# Patient Record
Sex: Male | Born: 2000 | Race: White | Hispanic: No | Marital: Single
Health system: Southern US, Community
[De-identification: ages and names within clinical notes are randomized; demographics above are authoritative.]

## PROBLEM LIST (undated history)

## (undated) DIAGNOSIS — K0889 Other specified disorders of teeth and supporting structures: Secondary | ICD-10-CM

## (undated) DIAGNOSIS — J45909 Unspecified asthma, uncomplicated: Secondary | ICD-10-CM

## (undated) DIAGNOSIS — R0981 Nasal congestion: Secondary | ICD-10-CM

## (undated) DIAGNOSIS — M6289 Other specified disorders of muscle: Secondary | ICD-10-CM

## (undated) DIAGNOSIS — F988 Other specified behavioral and emotional disorders with onset usually occurring in childhood and adolescence: Secondary | ICD-10-CM

## (undated) DIAGNOSIS — R29898 Other symptoms and signs involving the musculoskeletal system: Secondary | ICD-10-CM

## (undated) DIAGNOSIS — K0510 Chronic gingivitis, plaque induced: Secondary | ICD-10-CM

## (undated) DIAGNOSIS — R05 Cough: Secondary | ICD-10-CM

## (undated) DIAGNOSIS — K029 Dental caries, unspecified: Secondary | ICD-10-CM

## (undated) HISTORY — PX: ORCHIOPEXY: SHX479

---
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
## 2022-01-19 DIAGNOSIS — M0800 Unspecified juvenile rheumatoid arthritis of unspecified site: Secondary | ICD-10-CM | POA: Diagnosis not present

## 2022-01-19 DIAGNOSIS — H53023 Refractive amblyopia, bilateral: Secondary | ICD-10-CM | POA: Diagnosis not present

## 2022-01-19 DIAGNOSIS — H538 Other visual disturbances: Secondary | ICD-10-CM | POA: Diagnosis not present

## 2022-01-30 DIAGNOSIS — M0880 Other juvenile arthritis, unspecified site: Secondary | ICD-10-CM | POA: Diagnosis not present

## 2022-01-30 DIAGNOSIS — M461 Sacroiliitis, not elsewhere classified: Secondary | ICD-10-CM | POA: Diagnosis not present

## 2022-01-30 DIAGNOSIS — Z7969 Long term (current) use of other immunomodulators and immunosuppressants: Secondary | ICD-10-CM | POA: Diagnosis not present

## 2022-01-30 DIAGNOSIS — Z8261 Family history of arthritis: Secondary | ICD-10-CM | POA: Diagnosis not present

## 2022-01-30 DIAGNOSIS — M2460 Ankylosis, unspecified joint: Secondary | ICD-10-CM | POA: Diagnosis not present

## 2022-01-30 DIAGNOSIS — Z7960 Long term (current) use of unspecified immunomodulators and immunosuppressants: Secondary | ICD-10-CM | POA: Diagnosis not present

## 2022-01-30 DIAGNOSIS — R29898 Other symptoms and signs involving the musculoskeletal system: Secondary | ICD-10-CM | POA: Diagnosis not present

## 2022-01-30 DIAGNOSIS — R109 Unspecified abdominal pain: Secondary | ICD-10-CM | POA: Diagnosis not present

## 2022-01-30 DIAGNOSIS — M47812 Spondylosis without myelopathy or radiculopathy, cervical region: Secondary | ICD-10-CM | POA: Diagnosis not present

## 2022-05-21 DIAGNOSIS — Z7960 Long term (current) use of unspecified immunomodulators and immunosuppressants: Secondary | ICD-10-CM | POA: Diagnosis not present

## 2022-05-21 DIAGNOSIS — Z8379 Family history of other diseases of the digestive system: Secondary | ICD-10-CM | POA: Diagnosis not present

## 2022-05-21 DIAGNOSIS — M1288 Other specific arthropathies, not elsewhere classified, other specified site: Secondary | ICD-10-CM | POA: Diagnosis not present

## 2022-05-21 DIAGNOSIS — M0880 Other juvenile arthritis, unspecified site: Secondary | ICD-10-CM | POA: Diagnosis not present

## 2022-05-21 DIAGNOSIS — Z8719 Personal history of other diseases of the digestive system: Secondary | ICD-10-CM | POA: Diagnosis not present

## 2022-05-21 DIAGNOSIS — Z8261 Family history of arthritis: Secondary | ICD-10-CM | POA: Diagnosis not present

## 2022-05-21 DIAGNOSIS — D84821 Immunodeficiency due to drugs: Secondary | ICD-10-CM | POA: Diagnosis not present

## 2022-05-21 DIAGNOSIS — R609 Edema, unspecified: Secondary | ICD-10-CM | POA: Diagnosis not present

## 2022-05-21 DIAGNOSIS — R109 Unspecified abdominal pain: Secondary | ICD-10-CM | POA: Diagnosis not present

## 2022-05-21 DIAGNOSIS — Z79620 Long term (current) use of immunosuppressive biologic: Secondary | ICD-10-CM | POA: Diagnosis not present

## 2022-05-21 DIAGNOSIS — M461 Sacroiliitis, not elsewhere classified: Secondary | ICD-10-CM | POA: Diagnosis not present

## 2022-08-30 DIAGNOSIS — Z7960 Long term (current) use of unspecified immunomodulators and immunosuppressants: Secondary | ICD-10-CM | POA: Diagnosis not present

## 2022-08-30 DIAGNOSIS — M0880 Other juvenile arthritis, unspecified site: Secondary | ICD-10-CM | POA: Diagnosis not present

## 2022-08-30 DIAGNOSIS — M461 Sacroiliitis, not elsewhere classified: Secondary | ICD-10-CM | POA: Diagnosis not present

## 2022-10-31 DIAGNOSIS — Z7960 Long term (current) use of unspecified immunomodulators and immunosuppressants: Secondary | ICD-10-CM | POA: Diagnosis not present

## 2022-10-31 DIAGNOSIS — M461 Sacroiliitis, not elsewhere classified: Secondary | ICD-10-CM | POA: Diagnosis not present

## 2022-10-31 DIAGNOSIS — M0880 Other juvenile arthritis, unspecified site: Secondary | ICD-10-CM | POA: Diagnosis not present

## 2022-11-29 DIAGNOSIS — M0880 Other juvenile arthritis, unspecified site: Secondary | ICD-10-CM | POA: Diagnosis not present

## 2022-11-29 DIAGNOSIS — Z7960 Long term (current) use of unspecified immunomodulators and immunosuppressants: Secondary | ICD-10-CM | POA: Diagnosis not present

## 2022-11-29 DIAGNOSIS — M461 Sacroiliitis, not elsewhere classified: Secondary | ICD-10-CM | POA: Diagnosis not present

## 2022-12-11 ENCOUNTER — Ambulatory Visit: Payer: BLUE CROSS/BLUE SHIELD | Admitting: Family Medicine

## 2022-12-24 ENCOUNTER — Ambulatory Visit: Payer: BLUE CROSS/BLUE SHIELD | Admitting: Family Medicine

## 2023-01-21 DIAGNOSIS — H53023 Refractive amblyopia, bilateral: Secondary | ICD-10-CM | POA: Diagnosis not present

## 2023-01-21 DIAGNOSIS — M0800 Unspecified juvenile rheumatoid arthritis of unspecified site: Secondary | ICD-10-CM | POA: Diagnosis not present

## 2023-01-21 DIAGNOSIS — H538 Other visual disturbances: Secondary | ICD-10-CM | POA: Diagnosis not present

## 2023-03-05 ENCOUNTER — Ambulatory Visit: Payer: BLUE CROSS/BLUE SHIELD | Admitting: Family Medicine

## 2023-05-27 ENCOUNTER — Encounter: Payer: Self-pay | Admitting: Family Medicine

## 2023-05-27 ENCOUNTER — Ambulatory Visit: Payer: BC Managed Care – PPO | Admitting: Family Medicine

## 2023-05-27 VITALS — BP 126/66 | HR 76 | Temp 98.0°F | Ht 68.5 in | Wt 208.8 lb

## 2023-05-27 DIAGNOSIS — R6250 Unspecified lack of expected normal physiological development in childhood: Secondary | ICD-10-CM

## 2023-05-27 DIAGNOSIS — F988 Other specified behavioral and emotional disorders with onset usually occurring in childhood and adolescence: Secondary | ICD-10-CM | POA: Insufficient documentation

## 2023-05-27 DIAGNOSIS — M0880 Other juvenile arthritis, unspecified site: Secondary | ICD-10-CM

## 2023-05-27 DIAGNOSIS — F419 Anxiety disorder, unspecified: Secondary | ICD-10-CM | POA: Diagnosis not present

## 2023-05-27 DIAGNOSIS — M461 Sacroiliitis, not elsewhere classified: Secondary | ICD-10-CM | POA: Diagnosis not present

## 2023-05-27 MED ORDER — CITALOPRAM HYDROBROMIDE 20 MG PO TABS
20.00 mg | ORAL_TABLET | Freq: Every day | ORAL | 3 refills | Status: DC
Start: 2023-05-27 — End: 2023-06-14

## 2023-05-27 NOTE — Assessment & Plan Note (Signed)
Follows with rheumatology as above.  On Enbrel and methotrexate.

## 2023-05-27 NOTE — Assessment & Plan Note (Signed)
Has some OCD features as well although likely has underlying generalized anxiety.  We did discuss referral to psychiatry however deferred for today.  They are interested in medication management.  We discussed referral to therapy however they would like to hold off on this for now as well.  We discussed treatment options.  Believe that he would do well with an SSRI.  Will start Celexa 20 mg daily.  We discussed potential side effects.  They will follow-up with [place] in a couple of weeks via MyChart and we can adjust medications as needed.

## 2023-05-27 NOTE — Progress Notes (Signed)
Spencer Henderson is a 22 y.o. male who presents today for an office visit.  He is a new patient.   Assessment/Plan:  Chronic Problems Addressed Today: Anxiety Has some OCD features as well although likely has underlying generalized anxiety.  We did discuss referral to psychiatry however deferred for today.  They are interested in medication management.  We discussed referral to therapy however they would like to hold off on this for now as well.  We discussed treatment options.  Believe that he would do well with an SSRI.  Will start Celexa 20 mg daily.  We discussed potential side effects.  They will follow-up with [place] in a couple of weeks via MyChart and we can adjust medications as needed.  ADD (attention deficit disorder) Not currently on any medications.  JIA (juvenile idiopathic arthritis) (HCC) [facility].  On Enbrel and methotrexate.  Symptoms are stable.  Sacroiliitis (HCC) Follows with rheumatology as above.  On Enbrel and methotrexate.  Developmental delay Per mother he has continued to show growth and improvement and is no longer seeing developmental pediatrician.  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     Subjective:  HPI:  See Assessment / plan for status of chronic conditions.   Patient is here with his mother today.  They are here to establish care.  See A/P for status of chronic conditions.  He has history significant for global developmental delay that was previously followed by developmental pediatrician.  He has been released from the practice due to aging out.  He did graduate high school and has been working with a program to help him ease into the workforce.  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  He is very excited about this.  He also has history of juvenile idiopathic arthritis and is following with rheumatology for this.  Currently on Enbrel and  methotrexate.  Overall the symptoms are stable.  He has been diagnosed with ADD in the past.  Was previously on stimulants however did not tolerate side effects and he has not been on any stimulants for quite a while.  Corrie Dandy concern today is anxiety and OCD symptoms.  This has been going on for many years for most of his childhood.  He has previously discussed this with his previous PCP and has been on medications for this in the past but does not remember the name of the medications.  He has not been on any medications for several years.  No reported SI or HI.  Mother states that he needs Kostic reassuring and has ritualistic behaviors that cause distress when not followed.  ROS: Per HPI, otherwise a complete review of systems was negative.   PMH:  The following were reviewed and entered/updated in epic: Past Medical History:  Diagnosis Date   ADD (attention deficit disorder)    Asthma    prn inhaler   Cough 03/07/2015   Dental cavities 02/2015   Gingivitis 02/2015   Hypotonia    Stuffy nose 03/07/2015   Tooth loose 03/07/2015   Patient Active Problem List   Diagnosis Date Noted   JIA (juvenile idiopathic arthritis) (HCC) 05/27/2023   Sacroiliitis (HCC) 05/27/2023   ADD (attention deficit disorder) 05/27/2023   Anxiety 05/27/2023   Developmental delay 06/05/2018   Past Surgical History:  Procedure Laterality Date   DENTAL RESTORATION/EXTRACTION WITH X-RAY N/A 03/11/2015   Procedure: FULL MOUTH DENTAL REHAB  RESTORATIVES/EXTRACTIONS WITH X-RAYS;  Surgeon: Winfield Rast, DMD;  Location: [facility];  Service: Dentistry;  Laterality: N/A;   ORCHIOPEXY     age 74    Family History  Problem Relation Age of Onset   Asthma Brother    Hypertension Maternal Grandmother    ADD / ADHD Maternal Grandmother    Diabetes Maternal Grandfather    COPD Maternal Grandfather    ADD / ADHD Mother    ADD / ADHD Maternal Uncle    Autism Neg Hx    Migraines Neg Hx    Seizures Neg Hx     Anxiety disorder Neg Hx    Depression Neg Hx    Bipolar disorder Neg Hx    Schizophrenia Neg Hx     Medications- reviewed and updated Current Outpatient Medications  Medication Sig Dispense Refill   b complex vitamins tablet Take 1 tablet by mouth daily.     citalopram (CELEXA) 20 MG tablet Take 1 tablet (20 mg total) by mouth daily. 30 tablet 3   ENBREL SURECLICK 50 MG/ML injection   2   folic acid (FOLVITE) 1 MG tablet Take by mouth.     ibuprofen (ADVIL,MOTRIN) 400 MG tablet Take 200 mg by mouth every 6 (six) hours as needed.     magnesium 30 MG tablet Take 30 mg by mouth 2 (two) times daily.     RASUVO 20 MG/0.4ML SOAJ Inject into the skin.     No current facility-administered medications for this visit.    Allergies-reviewed and updated No Known Allergies  Social History   Socioeconomic History   Marital status: Single    Spouse name: Not on file   Number of children: Not on file   Years of education: Not on file   Highest education level: Not on file  Occupational History   Not on file  Tobacco Use   Smoking status: Passive Smoke Exposure - Never Smoker   Smokeless tobacco: Never   Tobacco comments:    grandmother smokes outside  Substance and Sexual Activity   Alcohol use: No   Drug use: No   Sexual activity: Not on file  Other Topics Concern   Not on file  Social History Narrative   Lives with mom, dad and brother. He is in the 9th grade at [employer]. He enjoys yard work, going to [employer], trade [employer], and playing video games.       Mom states patient was dx with Juvenile Idiopathic Arthritis. He in now on the enbrel shot and mom states it has been great   Social Determinants of Corporate investment banker Strain: Not on file  Food Insecurity: Not on file  Transportation Needs: Not on file  Physical Activity: Not on file  Stress: Not on file  Social Connections: Not on file          Objective:  Physical Exam: BP 126/66   Pulse 76   Temp 98 F  (36.7 C) (Temporal)   Ht 5' 8.5" (1.74 m)   Wt 208 lb 12.8 oz (94.7 kg)   SpO2 99%   BMI 31.29 kg/m   Gen: No acute distress, resting comfortably CV: Regular rate and rhythm with no murmurs appreciated Pulm: Normal work of breathing, clear to auscultation bilaterally with no crackles, wheezes, or rhonchi Neuro: Grossly normal, moves all extremities Psych: Normal affect and thought content      Latalia Etzler M. Jimmey Ralph, MD 05/27/2023 1:39 PM

## 2023-05-27 NOTE — Assessment & Plan Note (Signed)
Per mother he has continued to show growth and improvement and is no longer seeing developmental pediatrician.  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

## 2023-05-27 NOTE — Patient Instructions (Addendum)
It was very nice to see you today!  Please start the Celexa 20 mg daily.  Let me know in a few weeks how this is working for you.  It would take a few weeks to build up to its maximal effectiveness.  We can make changes as needed.  Return in about 1 year (around 05/26/2024) for Annual Physical.   Take care, Dr Jimmey Ralph  PLEASE NOTE:  If you had any lab tests, please let us know if you have not heard back within a few days. You may see your results on mychart before we have a chance to review them but we will give you a call once they are reviewed by [place].   If we ordered any referrals today, please let us know if you have not heard from their office within the next week.   If you had any urgent prescriptions sent in today, please check with the pharmacy within an hour of our visit to make sure the prescription was transmitted appropriately.   Please try these tips to maintain a healthy lifestyle:  Eat at least 3 REAL meals and 1-2 snacks per day.  Aim for no more than 5 hours between eating.  If you eat breakfast, please do so within one hour of getting up.   Each meal should contain half fruits/vegetables, one quarter protein, and one quarter carbs (no bigger than a computer mouse)  Cut down on sweet beverages. This includes juice, soda, and sweet tea.   Drink at least 1 glass of water with each meal and aim for at least 8 glasses per day  Exercise at least 150 minutes every week.

## 2023-05-27 NOTE — Assessment & Plan Note (Signed)
Follows with [facility] at [facility].  On Enbrel and methotrexate.  Symptoms are stable.

## 2023-05-27 NOTE — Assessment & Plan Note (Signed)
Not currently on any medications

## 2023-06-06 DIAGNOSIS — M0880 Other juvenile arthritis, unspecified site: Secondary | ICD-10-CM | POA: Diagnosis not present

## 2023-06-06 DIAGNOSIS — M069 Rheumatoid arthritis, unspecified: Secondary | ICD-10-CM | POA: Diagnosis not present

## 2023-06-06 DIAGNOSIS — Z7960 Long term (current) use of unspecified immunomodulators and immunosuppressants: Secondary | ICD-10-CM | POA: Diagnosis not present

## 2023-06-11 ENCOUNTER — Telehealth: Payer: Self-pay | Admitting: Family Medicine

## 2023-06-11 NOTE — Telephone Encounter (Signed)
I appreciate the update.  Recommend increasing to 40 mg daily.  Please send in a prescription if needed.  He should follow-up with [place] in a couple of weeks.

## 2023-06-11 NOTE — Telephone Encounter (Signed)
See note

## 2023-06-11 NOTE — Telephone Encounter (Signed)
Caller states there has been no side effects to the Celexa nor has there been any improvement in symptoms as of yet

## 2023-06-14 ENCOUNTER — Other Ambulatory Visit: Payer: Self-pay | Admitting: *Deleted

## 2023-06-14 MED ORDER — CITALOPRAM HYDROBROMIDE 40 MG PO TABS: 40.0000 mg | ORAL_TABLET | Freq: Every day | ORAL | 3 refills | Status: DC

## 2023-06-14 NOTE — Telephone Encounter (Signed)
Left message to return call to our office at their convenience.  

## 2023-06-14 NOTE — Telephone Encounter (Signed)
Patient mom returned call, notified PCP recommendation  New prescription send to pharmacy

## 2023-07-10 ENCOUNTER — Telehealth: Payer: Self-pay | Admitting: Family Medicine

## 2023-07-10 ENCOUNTER — Encounter (INDEPENDENT_AMBULATORY_CARE_PROVIDER_SITE_OTHER): Payer: Self-pay

## 2023-07-10 NOTE — Telephone Encounter (Signed)
Can we have them schedule a follow up visit to discuss?  Spencer Henderson. Jimmey Ralph, MD 07/10/2023 8:31 AM

## 2023-07-10 NOTE — Telephone Encounter (Signed)
Patient has been scheduled for 8.19.24 for a follow up to discuss medication. Patient's mother was wondering if it is okay to stop taking the medication for now since the appointment is a month out- please advise

## 2023-07-10 NOTE — Telephone Encounter (Signed)
Please advise 

## 2023-07-10 NOTE — Telephone Encounter (Signed)
Please schedule a F/U visit with PCP

## 2023-07-10 NOTE — Telephone Encounter (Signed)
Patient's mother states that since starting citalopram (CELEXA) 40 MG tablet Patient has gained approx. 18-20 pounds.  Requests to be advised if there is an alternative medication that does not cause weight gain.

## 2023-07-12 NOTE — Telephone Encounter (Signed)
If he is having improvement in mood then he should continue. If the medication has not been effective for his symptoms then recommend they decrease to 20 mg daily until they see me.  Katina Degree. Jimmey Ralph, MD 07/12/2023 5:42 PM

## 2023-07-15 NOTE — Telephone Encounter (Signed)
Patient's mother states she was returning Stella's call. Requests to be called back or left a detailed VM if call missed due to work.

## 2023-07-15 NOTE — Telephone Encounter (Signed)
Left message to return call to our office at their convenience.  

## 2023-07-16 NOTE — Telephone Encounter (Signed)
Spoke with patient mom, stated patient stop Rx Celexa since he  gained estra pound, have not seen any improvement with on medication  Patient lost 5 lb since he stop Rx. Has a F/U appt on 07/29/2023

## 2023-07-29 ENCOUNTER — Ambulatory Visit: Payer: BC Managed Care – PPO | Admitting: Family Medicine

## 2023-07-29 ENCOUNTER — Encounter: Payer: Self-pay | Admitting: Family Medicine

## 2023-07-29 VITALS — BP 136/72 | HR 63 | Temp 97.8°F | Ht 68.0 in | Wt 227.0 lb

## 2023-07-29 DIAGNOSIS — F419 Anxiety disorder, unspecified: Secondary | ICD-10-CM

## 2023-07-29 MED ORDER — ESCITALOPRAM OXALATE 10 MG PO TABS
10.0000 mg | ORAL_TABLET | Freq: Every day | ORAL | 3 refills | Status: DC
Start: 2023-07-29 — End: 2023-08-20

## 2023-07-29 NOTE — Progress Notes (Signed)
   Spencer Henderson is a 22 y.o. male who presents today for an office visit.  Assessment/Plan:  Chronic Problems Addressed Today: Anxiety I had a lengthy discussion with patient and his mother today regarding treatment for his anxiety.  Also is having some OTC features as well.  He did not have much response to Celexa and this additionally caused some issues with weight gain.  We will try Lexapro 10 mg daily.  Hopefully this will help and also limit his weight gain side effects.  They will follow-up with [place] in a couple of weeks via MyChart.  If he does not have a great response to Lexapro would consider trial of SNRI such as Pristiq versus clomipramine.      Subjective:  HPI:  See Assessment / plan for status of chronic conditions. Patient is here today for follow up. We last saw him a couple of months ago for initial visit.  At that time discussed anxiety with OCD features.  We started him on Celexa 20 mg daily.  Unfortunately he gained about 18 to 20 pounds once we increased his doseTo the 40 mg formulation they have since discontinued this.  Overall, they did not think that the Celexa helped significantly with his anxiety or OCD symptoms.  He has went back to work and he is lost a few pounds over the last week or so since stopping the medication.       Objective:  Physical Exam: BP 136/72   Pulse 63   Temp 97.8 F (36.6 C) (Temporal)   Ht 5\' 8"  (1.727 m)   Wt 227 lb (103 kg)   SpO2 99%   BMI 34.52 kg/m   Gen: No acute distress, resting comfortably Neuro: Grossly normal, moves all extremities Psych: Normal affect and thought content      Maddeline Roorda M. Jimmey Ralph, MD 07/29/2023 7:48 AM

## 2023-07-29 NOTE — Assessment & Plan Note (Signed)
I had a lengthy discussion with patient and his mother today regarding treatment for his anxiety.  Also is having some OTC features as well.  He did not have much response to Celexa and this additionally caused some issues with weight gain.  We will try Lexapro 10 mg daily.  Hopefully this will help and also limit his weight gain side effects.  They will follow-up with [place] in a couple of weeks via MyChart.  If he does not have a great response to Lexapro would consider trial of SNRI such as Pristiq versus clomipramine.

## 2023-08-15 ENCOUNTER — Telehealth: Payer: Self-pay | Admitting: Family Medicine

## 2023-08-15 NOTE — Telephone Encounter (Addendum)
Patient's mother called stating PCP informed her to call about patient's medication. States patient took the lexapro for a week and within that week he started sweating profusely. Patient stopped taking the medication 2 days ago. States that patient has been having nose bleeds twice a week for about a month.   Patient is scheduled for 08/20/23 @ 7:20 am.

## 2023-08-15 NOTE — Telephone Encounter (Signed)
See nnote

## 2023-08-16 NOTE — Telephone Encounter (Signed)
Noted. Nosebleeds are unlikely to be related to the medication. We can discuss more at their upcoming appointment.  Katina Degree. Jimmey Ralph, MD 08/16/2023 7:28 AM

## 2023-08-20 ENCOUNTER — Encounter: Payer: Self-pay | Admitting: Family Medicine

## 2023-08-20 ENCOUNTER — Ambulatory Visit: Payer: BC Managed Care – PPO | Admitting: Family Medicine

## 2023-08-20 VITALS — BP 119/78 | HR 57 | Temp 97.8°F | Ht 68.0 in | Wt 225.8 lb

## 2023-08-20 DIAGNOSIS — F419 Anxiety disorder, unspecified: Secondary | ICD-10-CM | POA: Diagnosis not present

## 2023-08-20 DIAGNOSIS — R040 Epistaxis: Secondary | ICD-10-CM

## 2023-08-20 MED ORDER — BUSPIRONE HCL 5 MG PO TABS: 5.0000 mg | ORAL_TABLET | Freq: Two times a day (BID) | ORAL | 5 refills | Status: DC

## 2023-08-20 MED ORDER — MUPIROCIN 2 % EX OINT: 1.0000 | TOPICAL_OINTMENT | Freq: Two times a day (BID) | CUTANEOUS | 0 refills | Status: DC

## 2023-08-20 NOTE — Patient Instructions (Signed)
It was very nice to see you today!  Please try placing a very small amount of mupirocin to the front for your nose twice daily for the next 10 days.  Hopefully dissolve the nosebleeds.  He can also use Afrin nasal spray if you have any recurrence of nosebleeds the dry do not use this more than 2-3 times per week.  Please try the BuSpar for your anxiety.  We are starting at a low dose.  Sned me a message in a week or so to let me know how you are doing and we can increase the dose if needed.  Return in about 4 weeks (around 09/17/2023).   Take care, Dr Jimmey Ralph  PLEASE NOTE:  If you had any lab tests, please let us know if you have not heard back within a few days. You may see your results on mychart before we have a chance to review them but we will give you a call once they are reviewed by [place].   If we ordered any referrals today, please let us know if you have not heard from their office within the next week.   If you had any urgent prescriptions sent in today, please check with the pharmacy within an hour of our visit to make sure the prescription was transmitted appropriately.   Please try these tips to maintain a healthy lifestyle:  Eat at least 3 REAL meals and 1-2 snacks per day.  Aim for no more than 5 hours between eating.  If you eat breakfast, please do so within one hour of getting up.   Each meal should contain half fruits/vegetables, one quarter protein, and one quarter carbs (no bigger than a computer mouse)  Cut down on sweet beverages. This includes juice, soda, and sweet tea.   Drink at least 1 glass of water with each meal and aim for at least 8 glasses per day  Exercise at least 150 minutes every week.

## 2023-08-20 NOTE — Progress Notes (Signed)
   Spencer Henderson is a 22 y.o. male who presents today for an office visit.  Assessment/Plan:  Chronic Problems Addressed Today: Anxiety Has not done well with Celexa or Lexapro due to side effects.  Overall anxiety is stable though still well-controlled.  We discussed other medication options including clomipramine, Pristiq, and BuSpar.  He has previously had BuSpar when he was a child and he did well with this.  Would be reasonable to try this again.  Will start 5 mg twice daily.  Discussed with patient and his mother we will likely need to escalate dose before he sees much of an effect.  They will follow-up with me in a week or so and we can titrate the dose as needed.    Epistaxis No active bleeding on exam today though does have some inflamed areas in anterior nares.  Will start mupirocin twice daily for the next 5 to 10 days.  Also recommended using Afrin if needed for uncontrolled nosebleeds.  They will let us know if not proving in the next few weeks and we can consider referral to ENT at that time if needed.     Subjective:  HPI:  See A/P for status of chronic conditions.  Patient is here today for follow-up.  He was seen about 3 weeks ago.  At that visit we had extensive discussion regarding his anxiety.  He initially did not do well with Celexa and we tried Lexapro at his most recent office visit.  Unfortunately he started developing profuse sweating and stopped the medication a couple of weeks after starting. He was not sure if it helped with his anxiety.  He also has been having nose bleeds the last several months. Usually holds pressure and it stops. Sometimes has to hold pressure for 5 to 10 minutes.  He had this several years ago but resolved spontaneously.       Objective:  Physical Exam: BP 119/78   Pulse (!) 57   Temp 97.8 F (36.6 C) (Temporal)   Ht 5\' 8"  (1.727 m)   Wt 225 lb 12.8 oz (102.4 kg)   SpO2 99%   BMI 34.33 kg/m   Gen: No acute distress, resting  comfortably HEENT: No signs of active bleeding noted on nasal exam today though does have a few inflamed areas at anterior distal septum bilaterally.  CV: Regular rate and rhythm with no murmurs appreciated Pulm: Normal work of breathing, clear to auscultation bilaterally with no crackles, wheezes, or rhonchi Neuro: Grossly normal, moves all extremities Psych: Normal affect and thought content      Aunesty Tyson M. Jimmey Ralph, MD 08/20/2023 7:52 AM

## 2023-08-20 NOTE — Assessment & Plan Note (Signed)
Has not done well with Celexa or Lexapro due to side effects.  Overall anxiety is stable though still well-controlled.  We discussed other medication options including clomipramine, Pristiq, and BuSpar.  He has previously had BuSpar when he was a child and he did well with this.  Would be reasonable to try this again.  Will start 5 mg twice daily.  Discussed with patient and his mother we will likely need to escalate dose before he sees much of an effect.  They will follow-up with me in a week or so and we can titrate the dose as needed.

## 2023-08-20 NOTE — Assessment & Plan Note (Signed)
No active bleeding on exam today though does have some inflamed areas in anterior nares.  Will start mupirocin twice daily for the next 5 to 10 days.  Also recommended using Afrin if needed for uncontrolled nosebleeds.  They will let us know if not proving in the next few weeks and we can consider referral to ENT at that time if needed.

## 2023-08-27 ENCOUNTER — Telehealth: Payer: Self-pay | Admitting: Family Medicine

## 2023-08-27 NOTE — Telephone Encounter (Signed)
Patient's mother called to let PCP know that patient is managing well on the buspar 5 mg. States that she did find patient's old bottle of buspar from when he was 39. States according to the bottle, pt was taking 10 mg BID.

## 2023-08-28 ENCOUNTER — Other Ambulatory Visit: Payer: Self-pay | Admitting: *Deleted

## 2023-08-28 MED ORDER — BUSPIRONE HCL 10 MG PO TABS: 10.0000 mg | ORAL_TABLET | Freq: Two times a day (BID) | ORAL | 1 refills | Status: DC

## 2023-08-28 NOTE — Telephone Encounter (Signed)
See note

## 2023-08-28 NOTE — Telephone Encounter (Signed)
I am glad he is doing well.  They can increase to 10 mg twice daily and follow-up with [place] in a few weeks.  Please send a new prescription if needed.

## 2023-08-28 NOTE — Telephone Encounter (Signed)
Patient aware new Rx send to pharmacy

## 2023-09-24 DIAGNOSIS — M069 Rheumatoid arthritis, unspecified: Secondary | ICD-10-CM | POA: Diagnosis not present

## 2023-09-24 DIAGNOSIS — Z7960 Long term (current) use of unspecified immunomodulators and immunosuppressants: Secondary | ICD-10-CM | POA: Diagnosis not present

## 2023-09-24 DIAGNOSIS — M0880 Other juvenile arthritis, unspecified site: Secondary | ICD-10-CM | POA: Diagnosis not present

## 2023-09-25 ENCOUNTER — Telehealth: Payer: Self-pay | Admitting: Family Medicine

## 2023-09-25 NOTE — Telephone Encounter (Signed)
Pt mother states pt is taking the med  busPIRone (BUSPAR) 10 MG tablet  Well and mom states she wants an increase. Please advise.

## 2023-09-26 ENCOUNTER — Other Ambulatory Visit: Payer: Self-pay | Admitting: Family Medicine

## 2023-09-27 NOTE — Telephone Encounter (Signed)
Please advise 

## 2023-09-30 ENCOUNTER — Other Ambulatory Visit: Payer: Self-pay | Admitting: *Deleted

## 2023-09-30 MED ORDER — BUSPIRONE HCL 15 MG PO TABS: 15.0000 mg | ORAL_TABLET | Freq: Two times a day (BID) | ORAL | 1 refills | Status: DC

## 2023-09-30 NOTE — Telephone Encounter (Signed)
Spoke with patient mother, stated taking 10mg  buspar bid for a couple of month  Not seen improvement on his anxiety or OCD Please advise

## 2023-09-30 NOTE — Telephone Encounter (Signed)
Please verify current dose. If they are on 5 mg twice daily then it is ok to increase to 10 mg twice daily and have them follow up in a few weeks.  Katina Degree. Jimmey Ralph, MD 09/30/2023 12:57 PM

## 2023-09-30 NOTE — Telephone Encounter (Signed)
Please send in new prescription for 15 mg twice daily and have them follow up in a couple of weeks.  Spencer Henderson. Jimmey Ralph, MD 09/30/2023 3:22 PM

## 2023-09-30 NOTE — Telephone Encounter (Signed)
Rx send to pharmacy  Notified, let us know in a couple of week how medication changes is working

## 2024-03-25 ENCOUNTER — Other Ambulatory Visit: Payer: Self-pay | Admitting: Family Medicine

## 2024-05-29 ENCOUNTER — Encounter: Payer: Self-pay | Admitting: Family Medicine

## 2024-05-29 ENCOUNTER — Ambulatory Visit: Payer: BC Managed Care – PPO | Admitting: Family Medicine

## 2024-05-29 VITALS — BP 103/63 | HR 66 | Temp 97.5°F | Ht 68.0 in | Wt 169.0 lb

## 2024-05-29 DIAGNOSIS — F988 Other specified behavioral and emotional disorders with onset usually occurring in childhood and adolescence: Secondary | ICD-10-CM | POA: Diagnosis not present

## 2024-05-29 DIAGNOSIS — Z131 Encounter for screening for diabetes mellitus: Secondary | ICD-10-CM | POA: Diagnosis not present

## 2024-05-29 DIAGNOSIS — Z13220 Encounter for screening for lipoid disorders: Secondary | ICD-10-CM

## 2024-05-29 DIAGNOSIS — Z0001 Encounter for general adult medical examination with abnormal findings: Secondary | ICD-10-CM

## 2024-05-29 DIAGNOSIS — M0880 Other juvenile arthritis, unspecified site: Secondary | ICD-10-CM

## 2024-05-29 DIAGNOSIS — F419 Anxiety disorder, unspecified: Secondary | ICD-10-CM | POA: Diagnosis not present

## 2024-05-29 DIAGNOSIS — R634 Abnormal weight loss: Secondary | ICD-10-CM | POA: Diagnosis not present

## 2024-05-29 LAB — COMPREHENSIVE METABOLIC PANEL WITH GFR
ALT: 19 U/L (ref 0–53)
AST: 19 U/L (ref 0–37)
Albumin: 4.7 g/dL (ref 3.5–5.2)
Alkaline Phosphatase: 72 U/L (ref 39–117)
BUN: 13 mg/dL (ref 6–23)
CO2: 28 meq/L (ref 19–32)
Calcium: 9.9 mg/dL (ref 8.4–10.5)
Chloride: 100 meq/L (ref 96–112)
Creatinine, Ser: 0.7 mg/dL (ref 0.40–1.50)
GFR: 131.77 mL/min (ref >60.00)
Glucose, Bld: 90 mg/dL (ref 70–99)
Potassium: 4.1 meq/L (ref 3.5–5.1)
Sodium: 139 meq/L (ref 135–145)
Total Bilirubin: 2.5 mg/dL — ABNORMAL HIGH (ref 0.2–1.2)
Total Protein: 6.7 g/dL (ref 6.0–8.3)

## 2024-05-29 LAB — VITAMIN B12: Vitamin B-12: 357 pg/mL (ref 211–911)

## 2024-05-29 LAB — CBC
HCT: 44.6 % (ref 39.0–52.0)
Hemoglobin: 15.2 g/dL (ref 13.0–17.0)
MCHC: 34.2 g/dL (ref 30.0–36.0)
MCV: 90.8 fl (ref 78.0–100.0)
Platelets: 144 10*3/uL — ABNORMAL LOW (ref 150.0–400.0)
RBC: 4.91 Mil/uL (ref 4.22–5.81)
RDW: 12.1 % (ref 11.5–15.5)
WBC: 7 10*3/uL (ref 4.0–10.5)

## 2024-05-29 LAB — LIPID PANEL
Cholesterol: 170 mg/dL (ref 0–200)
HDL: 58.9 mg/dL (ref >39.00)
LDL Cholesterol: 91 mg/dL (ref 0–99)
NonHDL: 110.94
Total CHOL/HDL Ratio: 3
Triglycerides: 101 mg/dL (ref 0.0–149.0)
VLDL: 20.2 mg/dL (ref 0.0–40.0)

## 2024-05-29 LAB — HEMOGLOBIN A1C: Hgb A1c MFr Bld: 5.5 % (ref 4.6–6.5)

## 2024-05-29 LAB — TSH: TSH: 0.72 u[IU]/mL (ref 0.35–5.50)

## 2024-05-29 MED ORDER — CLOMIPRAMINE HCL 25 MG PO CAPS: 25.0000 mg | ORAL_CAPSULE | Freq: Every day | ORAL | 3 refills | Status: DC

## 2024-05-29 NOTE — Assessment & Plan Note (Signed)
 Patient is down about 50 pounds on our scale.  Sounds like it is likely secondary to decreased food intake however we will check labs today including CBC, c-Met, TSH.  Discussed importance of eating at least 3 balanced meals per day.  Advised him to monitor his caloric intake over the next several weeks and also closely monitor his weight at home.  They will follow-up with us  in a few weeks.  We did discuss referral to GI for further evaluation due to prior concern for Crohn's disease however they would like to hold off on this for now.  They will let us  know if they change their mind.

## 2024-05-29 NOTE — Assessment & Plan Note (Signed)
 Follows with [facility].  Symptoms are stable on Enbrel.

## 2024-05-29 NOTE — Assessment & Plan Note (Signed)
 Did not find the BuSpar  was effective.  Has tried Celexa  and Lexapro  in the past but did not tolerate due to side effects.  He is having OCD type symptoms as well.  We did discuss other potential treatment options.  Will try clomipramine.  Start 25 mg daily.  They will send me a message in a few weeks via MyChart and we can increase the dose as tolerated.  We discussed potential side effects.  Will also place referral to psychiatrist today.

## 2024-05-29 NOTE — Progress Notes (Signed)
 Chief Complaint:  Spencer Henderson is a 23 y.o. male who presents today for his annual comprehensive physical exam.    Assessment/Plan:  Chronic Problems Addressed Today: Weight loss Patient is down about 50 pounds on our scale.  Sounds like it is likely secondary to decreased food intake however we will check labs today including CBC, c-Met, TSH.  Discussed importance of eating at least 3 balanced meals per day.  Advised him to monitor his caloric intake over the next several weeks and also closely monitor his weight at home.  They will follow-up with us  in a few weeks.  We did discuss referral to GI for further evaluation due to prior concern for Crohn's disease however they would like to hold off on this for now.  They will let us  know if they change their mind.  Anxiety Did not find the BuSpar  was effective.  Has tried Celexa  and Lexapro  in the past but did not tolerate due to side effects.  He is having OCD type symptoms as well.  We did discuss other potential treatment options.  Will try clomipramine.  Start 25 mg daily.  They will send me a message in a few weeks via MyChart and we can increase the dose as tolerated.  We discussed potential side effects.  Will also place referral to psychiatrist today.  JIA (juvenile idiopathic arthritis) (HCC) [facility]  Symptoms are stable on Enbrel.  Preventative Healthcare: Check labs.   Patient Counseling(The following topics were reviewed and/or handout was given):  -Nutrition: Stressed importance of moderation in sodium/caffeine intake, saturated fat and cholesterol, caloric balance, sufficient intake of fresh fruits, vegetables, and fiber.  -Stressed the importance of regular exercise.   -Substance Abuse: Discussed cessation/primary prevention of tobacco, alcohol, or other drug use; driving or other dangerous activities under the influence; availability of treatment for abuse.   -Injury prevention: Discussed safety  belts, safety helmets, smoke detector, smoking near bedding or upholstery.   -Sexuality: Discussed sexually transmitted diseases, partner selection, use of condoms, avoidance of unintended pregnancy and contraceptive alternatives.   -Dental health: Discussed importance of regular tooth brushing, flossing, and dental visits.  -Health maintenance and immunizations reviewed. Please refer to Health maintenance section.  Return to care in 1 year for next preventative visit.     Subjective:  HPI:  See A/P for status of chronic conditions.  Patient is here today for annual physical.  He is here with his mother today.  I last saw him about 9 months ago.  At that visit we discussed his anxiety and started on buspirone .  We escalated the dose over several weeks however he did not find that this was beneficial and discontinued this several months ago.  Since our last visit he has been following with rheumatology.  Currently his juvenile idiopathic arthritis symptoms are well-controlled on Enbrel.  His mother is concerned about his rate of weight loss over the last few years.  He has lost about 100 pounds since 2023.  He is down about 50 pounds since our last visit here.  Mother does note that he does not eat consistently and is typically only eating about 1 meal per day.  They discussed this with her rheumatologist and he had labs done there which were negative.  He was told at some point in the past that he would likely develop Crohn's disease.  He has not had any persistent diarrhea or abdominal pain or cramping.  No fevers or chills.  His mother is concerned that his diet is not nutritious.  He eats a lot of potatoes and yogurt.  A lot of juice and milk.  He is active with work but has no specific exercise regimen.     05/29/2024   12:48 PM  Depression screen PHQ 2/9  Decreased Interest 0  Down, Depressed, Hopeless 0  PHQ - 2 Score 0    Health Maintenance Due  Topic Date Due   Meningococcal B Vaccine  (1 of 2 - Standard) Never done     ROS: Per HPI, otherwise a complete review of systems was negative.   PMH:  The following were reviewed and entered/updated in epic: Past Medical History:  Diagnosis Date   ADD (attention deficit disorder)    Asthma    prn inhaler   Cough 03/07/2015   Dental cavities 02/2015   Gingivitis 02/2015   Hypotonia    Stuffy nose 03/07/2015   Tooth loose 03/07/2015   Patient Active Problem List   Diagnosis Date Noted   Weight loss 05/29/2024   Epistaxis 08/20/2023   JIA (juvenile idiopathic arthritis) (HCC) 05/27/2023   Sacroiliitis (HCC) 05/27/2023   ADD (attention deficit disorder) 05/27/2023   Anxiety 05/27/2023   Developmental delay 06/05/2018   Past Surgical History:  Procedure Laterality Date   DENTAL RESTORATION/EXTRACTION WITH X-RAY N/A 03/11/2015   Procedure: FULL MOUTH DENTAL REHAB RESTORATIVES/EXTRACTIONS WITH X-RAYS;  Surgeon: Benjiman Bras, DMD;  Location: [facility];  Service: Dentistry;  Laterality: N/A;   ORCHIOPEXY     age 17    Family History  Problem Relation Age of Onset   Asthma Brother    Hypertension Maternal Grandmother    ADD / ADHD Maternal Grandmother    Diabetes Maternal Grandfather    COPD Maternal Grandfather    ADD / ADHD Mother    ADD / ADHD Maternal Uncle    Autism Neg Hx    Migraines Neg Hx    Seizures Neg Hx    Anxiety disorder Neg Hx    Depression Neg Hx    Bipolar disorder Neg Hx    Schizophrenia Neg Hx     Medications- reviewed and updated Current Outpatient Medications  Medication Sig Dispense Refill   b complex vitamins tablet Take 1 tablet by mouth daily.     clomiPRAMINE (ANAFRANIL) 25 MG capsule Take 1 capsule (25 mg total) by mouth at bedtime. 90 capsule 3   ENBREL SURECLICK 50 MG/ML injection   2   folic acid (FOLVITE) 1 MG tablet Take by mouth.     ibuprofen (ADVIL,MOTRIN) 400 MG tablet Take 200 mg by mouth every 6 (six) hours as needed.     magnesium 30 MG tablet Take 30 mg by  mouth 2 (two) times daily.     RASUVO 20 MG/0.4ML SOAJ Inject into the skin.     mupirocin  ointment (BACTROBAN ) 2 % Apply 1 Application topically 2 (two) times daily. Apply to front part of nostril twice daily for 10 days 22 g 0   No current facility-administered medications for this visit.    Allergies-reviewed and updated No Known Allergies  Social History   Socioeconomic History   Marital status: Single    Spouse name: Not on file   Number of children: Not on file   Years of education: Not on file   Highest education level: Not on file  Occupational History   Not on file  Tobacco Use   Smoking status: Passive Smoke Exposure -  Never Smoker   Smokeless tobacco: Never   Tobacco comments:    grandmother smokes outside  Substance and Sexual Activity   Alcohol use: No   Drug use: No   Sexual activity: Not on file  Other Topics Concern   Not on file  Social History Narrative   Lives with mom, dad and brother. He is in the 9th grade at [employer]. He enjoys yard work, going to [employer], trade [employer], and playing video games.       Mom states patient was dx with Juvenile Idiopathic Arthritis. He in now on the enbrel shot and mom states it has been great   Social Drivers of Corporate investment banker Strain: Not on file  Food Insecurity: Not on file  Transportation Needs: Not on file  Physical Activity: Not on file  Stress: Not on file  Social Connections: Not on file        Objective:  Physical Exam: BP 103/63   Pulse 66   Temp (!) 97.5 F (36.4 C) (Temporal)   Ht 5' 8 (1.727 m)   Wt 169 lb (76.7 kg)   SpO2 95%   BMI 25.70 kg/m   Body mass index is 25.7 kg/m. Wt Readings from Last 3 Encounters:  05/29/24 169 lb (76.7 kg)  08/20/23 225 lb 12.8 oz (102.4 kg)  07/29/23 227 lb (103 kg)   Gen: NAD, resting comfortably HEENT: TMs normal bilaterally. OP clear. No thyromegaly noted.  CV: RRR with no murmurs appreciated Pulm: NWOB, CTAB with no crackles, wheezes, or  rhonchi GI: Normal bowel sounds present. Soft, Nontender, Nondistended. MSK: no edema, cyanosis, or clubbing noted Skin: warm, dry Neuro: CN2-12 grossly intact. Strength 5/5 in upper and lower extremities. Reflexes symmetric and intact bilaterally.  Psych: Normal affect and thought content     Spencer Henderson M. Daneil Dunker, MD 05/29/2024 1:20 PM

## 2024-05-29 NOTE — Patient Instructions (Signed)
 It was very nice to see you today!  We will check blood work today.  Please try the clomipramine to help with your anxiety and OCD.  Let me know in a week or 2 how this is working and we can increase the dose.  Please continue to monitor your weight.  Let us  know if you need referral to see gastroenterology.  Return in about 1 year (around 05/29/2025) for Annual Physical.   Take care, Dr Daneil Dunker  PLEASE NOTE:  If you had any lab tests, please let us  know if you have not heard back within a few days. You may see your results on mychart before we have a chance to review them but we will give you a call once they are reviewed by us .   If we ordered any referrals today, please let us  know if you have not heard from their office within the next week.   If you had any urgent prescriptions sent in today, please check with the pharmacy within an hour of our visit to make sure the prescription was transmitted appropriately.   Please try these tips to maintain a healthy lifestyle:  Eat at least 3 REAL meals and 1-2 snacks per day.  Aim for no more than 5 hours between eating.  If you eat breakfast, please do so within one hour of getting up.   Each meal should contain half fruits/vegetables, one quarter protein, and one quarter carbs (no bigger than a computer mouse)  Cut down on sweet beverages. This includes juice, soda, and sweet tea.   Drink at least 1 glass of water with each meal and aim for at least 8 glasses per day  Exercise at least 150 minutes every week.    Preventive Care 26-90 Years Old, Male Preventive care refers to lifestyle choices and visits with your health care provider that can promote health and wellness. Preventive care visits are also called wellness exams. What can I expect for my preventive care visit? Counseling During your preventive care visit, your health care provider may ask about your: Medical history, including: Past medical problems. Family medical  history. Current health, including: Emotional well-being. Home life and relationship well-being. Sexual activity. Lifestyle, including: Alcohol, nicotine or tobacco, and drug use. Access to firearms. Diet, exercise, and sleep habits. Safety issues such as seatbelt and bike helmet use. Sunscreen use. Work and work Astronomer. Physical exam Your health care provider may check your: Height and weight. These may be used to calculate your BMI (body mass index). BMI is a measurement that tells if you are at a healthy weight. Waist circumference. This measures the distance around your waistline. This measurement also tells if you are at a healthy weight and may help predict your risk of certain diseases, such as type 2 diabetes and high blood pressure. Heart rate and blood pressure. Body temperature. Skin for abnormal spots. What immunizations do I need?  Vaccines are usually given at various ages, according to a schedule. Your health care provider will recommend vaccines for you based on your age, medical history, and lifestyle or other factors, such as travel or where you work. What tests do I need? Screening Your health care provider may recommend screening tests for certain conditions. This may include: Lipid and cholesterol levels. Diabetes screening. This is done by checking your blood sugar (glucose) after you have not eaten for a while (fasting). Hepatitis B test. Hepatitis C test. HIV (human immunodeficiency virus) test. STI (sexually transmitted infection) testing, if  you are at risk. Talk with your health care provider about your test results, treatment options, and if necessary, the need for more tests. Follow these instructions at home: Eating and drinking  Eat a healthy diet that includes fresh fruits and vegetables, whole grains, lean protein, and low-fat dairy products. Drink enough fluid to keep your urine pale yellow. Take vitamin and mineral supplements as  recommended by your health care provider. Do not drink alcohol if your health care provider tells you not to drink. If you drink alcohol: Limit how much you have to 0-2 drinks a day. Know how much alcohol is in your drink. In the [place] one drink equals one 12 oz bottle of beer (355 mL), one 5 oz glass of wine (148 mL), or one 1 oz glass of hard liquor (44 mL). Lifestyle Brush your teeth every morning and night with fluoride toothpaste. Floss one time each day. Exercise for at least 30 minutes 5 or more days each week. Do not use any products that contain nicotine or tobacco. These products include cigarettes, chewing tobacco, and vaping devices, such as e-cigarettes. If you need help quitting, ask your health care provider. Do not use drugs. If you are sexually active, practice safe sex. Use a condom or other form of protection to prevent STIs. Find healthy ways to manage stress, such as: Meditation, yoga, or listening to music. Journaling. Talking to a trusted person. Spending time with friends and family. Minimize exposure to UV radiation to reduce your risk of skin cancer. Safety Always wear your seat belt while driving or riding in a vehicle. Do not drive: If you have been drinking alcohol. Do not ride with someone who has been drinking. If you have been using any mind-altering substances or drugs. While texting. When you are tired or distracted. Wear a helmet and other protective equipment during sports activities. If you have firearms in your house, make sure you follow all gun safety procedures. Seek help if you have been physically or sexually abused. What's next? Go to your health care provider once a year for an annual wellness visit. Ask your health care provider how often you should have your eyes and teeth checked. Stay up to date on all vaccines. This information is not intended to replace advice given to you by your health care provider. Make sure you discuss any  questions you have with your health care provider. Document Revised: 05/24/2021 Document Reviewed: 05/24/2021 Elsevier Patient Education  2024 [employer].

## 2024-06-01 ENCOUNTER — Ambulatory Visit: Payer: Self-pay | Admitting: Family Medicine

## 2024-06-01 DIAGNOSIS — Z9189 Other specified personal risk factors, not elsewhere classified: Secondary | ICD-10-CM

## 2024-06-01 NOTE — Progress Notes (Signed)
 His bilirubin level is elevated.  His platelets are also a little low.  This is likely nothing to worry about however recommend he come back for repeat testing.  Please place future order for CBC with differential, fractionated bilirubin and haptoglobin.   All of his other labs are at goal and we can recheck in a few years.

## 2024-06-04 NOTE — Telephone Encounter (Signed)
 FYI, patient has a lab appt

## 2024-06-09 ENCOUNTER — Other Ambulatory Visit (INDEPENDENT_AMBULATORY_CARE_PROVIDER_SITE_OTHER)

## 2024-06-09 DIAGNOSIS — Z9189 Other specified personal risk factors, not elsewhere classified: Secondary | ICD-10-CM | POA: Diagnosis not present

## 2024-06-09 LAB — CBC WITH DIFFERENTIAL/PLATELET
Basophils Absolute: 0 10*3/uL (ref 0.0–0.1)
Basophils Relative: 0.2 % (ref 0.0–3.0)
Eosinophils Absolute: 0 10*3/uL (ref 0.0–0.7)
Eosinophils Relative: 0.3 % (ref 0.0–5.0)
HCT: 43.1 % (ref 39.0–52.0)
Hemoglobin: 14.8 g/dL (ref 13.0–17.0)
Lymphocytes Relative: 28.1 % (ref 12.0–46.0)
Lymphs Abs: 1.9 10*3/uL (ref 0.7–4.0)
MCHC: 34.4 g/dL (ref 30.0–36.0)
MCV: 90.5 fl (ref 78.0–100.0)
Monocytes Absolute: 0.4 10*3/uL (ref 0.1–1.0)
Monocytes Relative: 6.4 % (ref 3.0–12.0)
Neutro Abs: 4.4 10*3/uL (ref 1.4–7.7)
Neutrophils Relative %: 65 % (ref 43.0–77.0)
Platelets: 162 10*3/uL (ref 150.0–400.0)
RBC: 4.76 Mil/uL (ref 4.22–5.81)
RDW: 11.9 % (ref 11.5–15.5)
WBC: 6.7 10*3/uL (ref 4.0–10.5)

## 2024-06-10 ENCOUNTER — Ambulatory Visit: Payer: Self-pay | Admitting: Family Medicine

## 2024-06-10 LAB — BILIRUBIN, FRACTIONATED(TOT/DIR/INDIR)
Bilirubin, Direct: 0.3 mg/dL — ABNORMAL HIGH (ref 0.0–0.2)
Indirect Bilirubin: 1.5 mg/dL — ABNORMAL HIGH (ref 0.2–1.2)
Total Bilirubin: 1.8 mg/dL — ABNORMAL HIGH (ref 0.2–1.2)

## 2024-06-10 LAB — HAPTOGLOBIN: Haptoglobin: 112 mg/dL (ref 43–212)

## 2024-06-10 NOTE — Progress Notes (Signed)
 His bilirubin levels are improving.  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  This is benign and should not cause him any issues other than occasionally high bilirubin level on labs.  We do not need to do any other testing at this point.

## 2024-06-11 NOTE — Telephone Encounter (Signed)
 I do not see where he has been diagnosed with this in the chart in the past but it is typically present since birth.  The elevations are low and temporary and do not cause any issues with liver or any other organs.   I generally like to check a few extra labs when we see an elevated bilirubin level to make sure there are not any other serious things that could be causing elevated bilirubin levels. Also it prevents further work up in the future when other physicians see his labs.  They are welcome to schedule an appointment to discuss if they have further questions.   Worth HERO. Kennyth, MD 06/11/2024 12:08 PM

## 2024-07-10 ENCOUNTER — Telehealth: Payer: Self-pay | Admitting: *Deleted

## 2024-07-10 NOTE — Telephone Encounter (Signed)
 Copied from CRM #[id number]. 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   States he is doing well on this medication and he just needs to be monitored and do not increase the dosage quickly.   Patient mother will bring medical records to be reviewed  Stated medication working well  [employer]

## 2024-07-13 NOTE — Telephone Encounter (Signed)
 I appreciate the update. If he is doing well then we can continue with current dose.  Worth HERO. Kennyth, MD 07/13/2024 7:35 AM

## 2024-08-17 ENCOUNTER — Telehealth: Payer: Self-pay | Admitting: *Deleted

## 2024-08-17 NOTE — Telephone Encounter (Signed)
 Copied from CRM [id number]. Topic: Clinical - Medication Question >> Aug 17, 2024  1:03 PM [name] wrote: Reason for CRM: pt mom states pt is doing well on the anti depressant mother is asking for a dosage increase.  Please reach out tp pt/mom

## 2024-08-17 NOTE — Telephone Encounter (Signed)
 Okay to increase clomipramine  to 50 mg daily and have him follow-up with us  in a few weeks.

## 2024-08-17 NOTE — Telephone Encounter (Signed)
 Patient mother notified Okay to increase clomipramine  to 50 mg daily and have him follow-up with us  in a few weeks. Verbalized understanding

## 2024-08-17 NOTE — Telephone Encounter (Signed)
**Note De-identified  [place] Obfuscation** Please advise 

## 2024-08-24 ENCOUNTER — Telehealth: Payer: Self-pay | Admitting: *Deleted

## 2024-08-24 NOTE — Telephone Encounter (Signed)
 Copied from CRM [id number]. Topic: Clinical - Prescription Issue >> Aug 24, 2024  2:49 PM Roselie BROCKS wrote: Reason for CRM: Patients mom called stating she was told to start giving the patient 2 of clomiPRAMINE  (ANAFRANIL ) 25 MG capsule, and if doing good , the prescription could be changed to 50 mg . And to call and let provider know how it is working. Patient mom states the 2 25mg  is working so much better, and wants it changed   Please advise  Corie Allis,RMA

## 2024-08-25 ENCOUNTER — Other Ambulatory Visit: Payer: Self-pay | Admitting: *Deleted

## 2024-08-25 MED ORDER — CLOMIPRAMINE HCL 50 MG PO CAPS: 50.0000 mg | ORAL_CAPSULE | Freq: Every day | ORAL | 1 refills | Status: DC

## 2024-08-25 NOTE — Telephone Encounter (Signed)
 Ok to send in new prescription for clomipramine  50 mg daily.

## 2024-08-25 NOTE — Telephone Encounter (Signed)
 Rx send to pharmacy

## 2024-10-05 ENCOUNTER — Telehealth: Payer: Self-pay | Admitting: *Deleted

## 2024-10-05 ENCOUNTER — Telehealth: Payer: Self-pay

## 2024-10-05 NOTE — Telephone Encounter (Signed)
 Copied from CRM #[id number]. Topic: Clinical - Medical Advice >> Oct 05, 2024 12:34 PM Thersia BROCKS wrote: Reason for CRM: patient mom called in regarding a message for Dr.Parker or Elora stated patient has been on a antidepression and doesnt feel like it has been working would like to know what is the next steps   [phone]    Please advise  [facility]

## 2024-10-05 NOTE — Telephone Encounter (Signed)
 Can we have them schedule an appointment?  Worth HERO. Kennyth, MD 10/05/2024 1:47 PM

## 2024-10-05 NOTE — Telephone Encounter (Signed)
 Please schedule an appt for medication consultation

## 2024-10-05 NOTE — Telephone Encounter (Signed)
 Please advise on message below and what next steps should be taken as current treatment plan is not working well. Thank you!    Copied from CRM #[id number]. Topic: Clinical - Medical Advice >> Oct 05, 2024 12:34 PM Thersia BROCKS wrote: Reason for CRM: patient mom called in regarding a message for Dr.Parker or Elora stated patient has been on a antidepression and doesnt feel like it has been working would like to know what is the next steps   [phone]

## 2024-10-05 NOTE — Telephone Encounter (Signed)
 Please see previous phone note.  Worth HERO. Kennyth, MD 10/05/2024 1:48 PM

## 2024-10-08 ENCOUNTER — Ambulatory Visit: Admitting: Family Medicine

## 2024-10-08 VITALS — BP 120/82 | HR 102 | Temp 97.4°F | Ht 68.0 in | Wt 182.4 lb

## 2024-10-08 DIAGNOSIS — F419 Anxiety disorder, unspecified: Secondary | ICD-10-CM | POA: Diagnosis not present

## 2024-10-08 DIAGNOSIS — G4700 Insomnia, unspecified: Secondary | ICD-10-CM | POA: Insufficient documentation

## 2024-10-08 MED ORDER — CLOMIPRAMINE HCL 25 MG PO CAPS: 25.0000 mg | ORAL_CAPSULE | Freq: Every day | ORAL | 0 refills | Status: DC

## 2024-10-08 MED ORDER — HYDROXYZINE HCL 50 MG PO TABS: 25.0000 mg | ORAL_TABLET | Freq: Every evening | ORAL | 0 refills | Status: DC | PRN

## 2024-10-08 NOTE — Patient Instructions (Signed)
 It was very nice to see you today!  VISIT SUMMARY: During your visit, we discussed your concerns about the side effects of clomipramine , particularly the emotional disturbances you have been experiencing. We also addressed your OCD symptoms and sleep disturbances.  YOUR PLAN: ANXIETY: Your OCD symptoms have been managed with clomipramine , but you are experiencing emotional side effects. -We will taper your clomipramine  by reducing the dose to 25 mg for 7-10 days, then take it every other day for 7-10 days, and then stop completely. -Monitor your anxiety symptoms after discontinuation and we will re-evaluate your anxiety management during your follow-up visit.  ADVERSE EFFECTS OF CLOMIPRAMINE : You have been experiencing increased emotionality, likely due to clomipramine . -We will taper your clomipramine  as outlined in the anxiety plan. -Monitor for resolution of emotional side effects after discontinuation.  SLEEP DISTURBANCE: You have had sleep disturbances that were previously managed with clomipramine . -We will prescribe hydroxyzine for sleep as needed.  Return in about 6 weeks (around 11/19/2024).   Take care, Dr Kennyth  PLEASE NOTE:  If you had any lab tests, please let us  know if you have not heard back within a few days. You may see your results on mychart before we have a chance to review them but we will give you a call once they are reviewed by us .   If we ordered any referrals today, please let us  know if you have not heard from their office within the next week.   If you had any urgent prescriptions sent in today, please check with the pharmacy within an hour of our visit to make sure the prescription was transmitted appropriately.   Please try these tips to maintain a healthy lifestyle:  Eat at least 3 REAL meals and 1-2 snacks per day.  Aim for no more than 5 hours between eating.  If you eat breakfast, please do so within one hour of getting up.   Each meal should  contain half fruits/vegetables, one quarter protein, and one quarter carbs (no bigger than a computer mouse)  Cut down on sweet beverages. This includes juice, soda, and sweet tea.   Drink at least 1 glass of water with each meal and aim for at least 8 glasses per day  Exercise at least 150 minutes every week.

## 2024-10-08 NOTE — Assessment & Plan Note (Addendum)
 Symptoms are not controlled.  He is having more irritability and emotional lability despite fully clomipramine .  Parents would like to discontinue this.  Advised him to go to 25 mg daily for 1 to 2 weeks and then decrease to 25 mg every other day for 1 to 2 weeks and then discontinue.  They will follow-up with me in 4 to 6 weeks.  We did again discuss referral to psychiatry as ideally his mental health will be treated by specialist at this point due to treatment failure and comorbidities however they would like to hold off on this for now due to issues with getting this scheduled due to privacy and legal issues from the referral at our most recent office visit.  They may be interested in having him see a therapist however would like to hold off on referral at this point as well.  If symptoms continue to be problematic at the time of next office visit would consider trial of gabapentin or atypical antipsychotic such as seroquel or Abilify.  We discussed reasons to return to care sooner than neck scheduled follow-up visit.

## 2024-10-08 NOTE — Progress Notes (Signed)
   Spencer Henderson is a 23 y.o. male who presents today for an office visit.  Assessment/Plan:  Chronic Problems Addressed Today: Anxiety Symptoms are not controlled.  He is having more irritability and emotional lability despite fully clomipramine .  Parents would like to discontinue this.  Advised him to go to 25 mg daily for 1 to 2 weeks and then decrease to 25 mg every other day for 1 to 2 weeks and then discontinue.  They will follow-up with me in 4 to 6 weeks.  We did again discuss referral to psychiatry as ideally his mental health will be treated by specialist at this point due to treatment failure and comorbidities however they would like to hold off on this for now due to issues with getting this scheduled due to privacy and legal issues from the referral at our most recent office visit.  They may be interested in having him see a therapist however would like to hold off on referral at this point as well.  If symptoms continue to be problematic at the time of next office visit would consider trial of gabapentin or atypical antipsychotic such as seroquel or Abilify.  We discussed reasons to return to care sooner than neck scheduled follow-up visit.   Insomnia Patient does note that his insomnia was improved with the clomipramine  and is concerned about discontinuing this.  We discussed alternative treatment options.  He is currently taking melatonin.  Will add on hydroxyzine as needed.  Discussed potential side effects and mechanism of action.  He will follow-up with us  in a few weeks as above.     Subjective:  HPI:  See assessment / plan for status of chronic conditions. Patient is here with his parents today for follow up. I last saw him about 4 months ago for his annual physical.  At that visit we discussed management for anxiety with OCD symptoms.  He was previously on Celexa  and Lexapro  as well as BuSpar  however these were not effective.  We started him on clomipramine  25 mg daily.   They did not see any significant improvement over the first several weeks and we subsequently increased to 50 mg daily.  They do think that has helped some with OCD symptoms however parents are concerned that he is having more emotional disturbances over the last several weeks including more irritability and becoming more upset over minor issues.  They noticed this after starting with provide range which worsened after increasing the dose.  He does feel like the medication has helped with managing his sleep.  He is currently taking melatonin for this.  We did refer him to see psychiatry at his most recent visit here however they have had difficulty with getting this set up as the psychiatrist office wants to see him alone first without his parents and due to his developmental delay this is very challenging to coordinate.         Objective:  Physical Exam: BP 120/82   Pulse (!) 102   Temp (!) 97.4 F (36.3 C) (Temporal)   Ht 5' 8 (1.727 m)   Wt 182 lb 6.4 oz (82.7 kg)   SpO2 98%   BMI 27.73 kg/m   Gen: No acute distress, resting comfortably Neuro: Grossly normal, moves all extremities Psych: Blunted affect.  Minimally verbal. Poor eye contact. No SI or HI.       Worth HERO. Kennyth, MD 10/08/2024 7:48 AM

## 2024-10-08 NOTE — Assessment & Plan Note (Signed)
 Patient does note that his insomnia was improved with the clomipramine  and is concerned about discontinuing this.  We discussed alternative treatment options.  He is currently taking melatonin.  Will add on hydroxyzine as needed.  Discussed potential side effects and mechanism of action.  He will follow-up with us  in a few weeks as above.

## 2024-10-17 ENCOUNTER — Other Ambulatory Visit: Payer: Self-pay | Admitting: Family Medicine

## 2024-10-19 NOTE — Telephone Encounter (Signed)
 Can we clarify with family? We had discuss stopping this at his most recent office visit.

## 2024-10-26 ENCOUNTER — Ambulatory Visit: Admitting: Family Medicine

## 2024-10-30 ENCOUNTER — Ambulatory Visit: Payer: Self-pay

## 2024-10-30 NOTE — Telephone Encounter (Signed)
 FYI Only or Action Required?: Action required by provider: referral request and refused offered appointment, next follow up scheduled 11/19/24.  Patient was last seen in primary care on 10/08/2024 by Kennyth Worth HERO, MD.  Called Nurse Triage reporting Anxiety and Referral.  Symptoms began Chronic.  Interventions attempted: Other: family support.  Symptoms are: gradually worsening.  Triage Disposition: See PCP When Office is Open (Within 3 Days)  Patient/caregiver understands and will follow disposition?: No   Copied from CRM #[id number]. Topic: Clinical - Red Word Triage >> Oct 30, 2024  9:07 AM Harlene ORN wrote: Red Word that prompted transfer to Nurse Triage: referral for psychiatrist or psychologist anxiety, withdrawn and doesn't want to speak to anyone. Reason for Disposition  MODERATE anxiety (e.g., persistent or frequent anxiety symptoms; interferes with sleep, school, or work)  Answer Assessment - Initial Assessment Questions Additional info: Mother calling in for psychiatric assistance. Requesting referral for psychiatric services, prefers male provider.  Declines offered acute visit. Next follow up is scheduled on 11/19/24  1. CONCERN: Did anything happen that prompted you to call today?      Withdrawn and increased anxiety, depressed mood worsening over one year 2. ANXIETY SYMPTOMS: Can you describe how you (your loved one; patient) have been feeling? (e.g., tense, restless, panicky, anxious, keyed up, overwhelmed, sense of impending doom).      Anxious 3. ONSET: How long have you been feeling this way? (e.g., hours, days, weeks)     chronic 4. SEVERITY: How would you rate the level of anxiety? (e.g., 0 - 10; or mild, moderate, severe).     moderate 5. FUNCTIONAL IMPAIRMENT: How have these feelings affected your ability to do daily activities? Have you had more difficulty than usual doing your normal daily activities? (e.g., getting better, same, worse;  self-care, school, work, interactions)     works 6. HISTORY: Have you felt this way before? Have you ever been diagnosed with an anxiety problem in the past? (e.g., generalized anxiety disorder, panic attacks, PTSD). If Yes, ask: How was this problem treated? (e.g., medicines, counseling, etc.)     ADD, developmental delay, sensory processing disorder 7. RISK OF HARM - SUICIDAL IDEATION: Do you ever have thoughts of hurting or killing yourself? If Yes, ask:  Do you have these feelings now? Do you have a plan on how you would do this?     denies 8. TREATMENT:  What has been done so far to treat this anxiety? (e.g., medicines, relaxation strategies). What has helped?     Failed medication trials 9. THERAPIST: Do you have a counselor or therapist? If Yes, ask: What is their name?     No current therapist  10. POTENTIAL TRIGGERS: Do you drink caffeinated beverages (e.g., coffee, colas, teas), and how much daily? Do you drink alcohol or use any drugs? Have you started any new medicines recently?       Chronic condition 11. PATIENT SUPPORT: Who is with you now? Who do you live with? Do you have family or friends who you can talk to?        family 67. OTHER SYMPTOMS: Do you have any other symptoms? (e.g., feeling depressed, trouble concentrating, trouble sleeping, trouble breathing, palpitations or fast heartbeat, chest pain, sweating, nausea, or diarrhea)        13. PREGNANCY: Is there any chance you are pregnant? When was your last menstrual period?  Protocols used: Anxiety and Panic Attack-A-AH

## 2024-11-02 ENCOUNTER — Telehealth: Payer: Self-pay

## 2024-11-02 NOTE — Telephone Encounter (Signed)
 I do not see anything to review.

## 2024-11-02 NOTE — Telephone Encounter (Signed)
 Can we please send a referral to  behavioral health per provider request.

## 2024-11-02 NOTE — Telephone Encounter (Signed)
Has already been handled

## 2024-11-02 NOTE — Telephone Encounter (Signed)
 We put a referral in in June but please place a new referral if needed and please give information for behavioral health urgent care if needed also.

## 2024-11-14 ENCOUNTER — Other Ambulatory Visit: Payer: Self-pay | Admitting: Family Medicine

## 2024-11-18 NOTE — Addendum Note (Signed)
 Addended by: FRANCIS ROULEAU A on: 11/18/2024 01:54 PM   Modules accepted: Orders

## 2024-11-19 ENCOUNTER — Ambulatory Visit: Admitting: Family Medicine

## 2024-11-20 ENCOUNTER — Ambulatory Visit: Admitting: Family Medicine

## 2024-11-23 ENCOUNTER — Encounter: Payer: Self-pay | Admitting: Family Medicine

## 2024-11-23 ENCOUNTER — Ambulatory Visit: Admitting: Family Medicine

## 2024-11-23 VITALS — BP 118/78 | HR 71 | Temp 98.5°F | Ht 68.0 in | Wt 190.4 lb

## 2024-11-23 DIAGNOSIS — F419 Anxiety disorder, unspecified: Secondary | ICD-10-CM | POA: Diagnosis not present

## 2024-11-23 DIAGNOSIS — G4700 Insomnia, unspecified: Secondary | ICD-10-CM

## 2024-11-23 NOTE — Assessment & Plan Note (Signed)
 Stable.  Has hydroxyzine  to use as needed.

## 2024-11-23 NOTE — Progress Notes (Signed)
° °  Spencer Henderson is a 23 y.o. male who presents today for an office visit.  Assessment/Plan:  Chronic Problems Addressed Today: Anxiety Symptoms have improved since our last visit.  He is no longer on clomipramine .  Patient feels like everything is fine but parent still notes significant changes in interpersonal communication difficulties.  We did bring up family counseling which they are agreeable however the patient does not feel like it would be beneficial.  He is open to trying it however.    We have placed 2 referrals already however they have not yet heard anything back.  We gave a pamphlet with contact information for them to call to schedule appointment.  They will let us  know if they have difficulty with getting in for counseling or if they need further assistance.   Insomnia Stable.  Has hydroxyzine  to use as needed.      Subjective:  HPI:  See assessment / plan for status of chronic conditions.  Patient is here today for follow-up.  I last saw him with his parents about 6 weeks ago.  At that time we discussed his ongoing issues with anxiety and insomnia.  At that time they had felt like the clomipramine  was not beneficial and elected to wean off of this.  We also started hydroxyzine  as needed due to concern for worsening insomnia with coming off the clomipramine .  He has done better with coming off the clomipramine .  Family does feel like he has noticed some positive improvement since stopping the medication however he is still very irritable and has frequent disagreements with the family.  Patient does not think that anything is wrong and insist that everything is fine.  Family does note continuing changes with this behavior and interactions with others.  Mother has tried contacting counseling services but has had difficulty with securing appointments.       Objective:  Physical Exam: BP 118/78   Pulse 71   Temp 98.5 F (36.9 C) (Temporal)   Ht 5' 8 (1.727 m)   Wt 190 lb  6.4 oz (86.4 kg)   SpO2 98%   BMI 28.95 kg/m   Gen: No acute distress, resting comfortably Neuro: Grossly normal, moves all extremities Psych: Normal affect and thought content      Yuvonne Lanahan M. Kennyth, MD 11/23/2024 7:53 AM

## 2024-11-23 NOTE — Assessment & Plan Note (Addendum)
 Symptoms have improved since our last visit.  He is no longer on clomipramine .  Patient feels like everything is fine but parent still notes significant changes in interpersonal communication difficulties.  We did bring up family counseling which they are agreeable however the patient does not feel like it would be beneficial.  He is open to trying it however.    We have placed 2 referrals already however they have not yet heard anything back.  We gave a pamphlet with contact information for them to call to schedule appointment.  They will let us  know if they have difficulty with getting in for counseling or if they need further assistance.

## 2024-11-23 NOTE — Patient Instructions (Addendum)
 It was very nice to see you today!  I think counseling is a good idea.  The referral will place this still being processed however you can call [facility] at [phone] schedule appointment for family counseling.  Return if symptoms worsen or fail to improve.   Take care, Dr Kennyth  PLEASE NOTE:  If you had any lab tests, please let us  know if you have not heard back within a few days. You may see your results on mychart before we have a chance to review them but we will give you a call once they are reviewed by us .   If we ordered any referrals today, please let us  know if you have not heard from their office within the next week.   If you had any urgent prescriptions sent in today, please check with the pharmacy within an hour of our visit to make sure the prescription was transmitted appropriately.   Please try these tips to maintain a healthy lifestyle:  Eat at least 3 REAL meals and 1-2 snacks per day.  Aim for no more than 5 hours between eating.  If you eat breakfast, please do so within one hour of getting up.   Each meal should contain half fruits/vegetables, one quarter protein, and one quarter carbs (no bigger than a computer mouse)  Cut down on sweet beverages. This includes juice, soda, and sweet tea.   Drink at least 1 glass of water with each meal and aim for at least 8 glasses per day  Exercise at least 150 minutes every week.

## 2024-12-22 ENCOUNTER — Telehealth (HOSPITAL_COMMUNITY): Payer: Self-pay | Admitting: Psychiatry

## 2024-12-22 NOTE — Telephone Encounter (Signed)
 D:  Placed call to discuss virtual MH-IOP with pt, but mother answered the phone.  Stated that pt was at work.  Mother had questions re: services.  Reports that son has special needs and wouldn't be interested in groups.  A:  Provided pt's mother with resources.  R:  She was receptive.
